# Patient Record
Sex: Male | Born: 1995 | Race: White | Hispanic: No | Marital: Single | Smoking: Former smoker
Health system: Southern US, Community
[De-identification: ages and names within clinical notes are randomized; demographics above are authoritative.]

## PROBLEM LIST (undated history)

## (undated) DIAGNOSIS — K219 Gastro-esophageal reflux disease without esophagitis: Secondary | ICD-10-CM

---
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
## 2015-06-17 ENCOUNTER — Ambulatory Visit
Admission: EM | Admit: 2015-06-17 | Discharge: 2015-06-17 | Disposition: A | Discharge status: Home / Self Care | Payer: Self-pay | Attending: Internal Medicine | Admitting: Internal Medicine

## 2015-06-17 ENCOUNTER — Encounter: Payer: Self-pay | Admitting: *Deleted

## 2015-06-17 DIAGNOSIS — K0889 Other specified disorders of teeth and supporting structures: Secondary | ICD-10-CM

## 2015-06-17 MED ORDER — IBUPROFEN 800 MG PO TABS: 800.0000 mg | ORAL_TABLET | Freq: Three times a day (TID) | ORAL | Status: DC | PRN

## 2015-06-17 MED ORDER — PENICILLIN V POTASSIUM 500 MG PO TABS: 500.0000 mg | ORAL_TABLET | Freq: Four times a day (QID) | ORAL | Status: DC

## 2015-06-17 MED ORDER — LIDOCAINE VISCOUS 2 % MT SOLN: 15.0000 mL | Freq: Three times a day (TID) | OROMUCOSAL | Status: DC | PRN

## 2015-06-17 MED ORDER — LIDOCAINE VISCOUS 2 % MT SOLN: OROMUCOSAL | Status: DC

## 2015-06-17 NOTE — ED Notes (Signed)
Pt states a dental abcess on left lower with pain x1 week.

## 2015-06-17 NOTE — Discharge Instructions (Signed)
Take medication as prescribed. Rest. Drink plenty of fluids.  ° °Follow up closely with your dentist. ° °Follow up with your primary care physician this week as needed. Return to Urgent care for new or worsening concerns.  ° °

## 2015-06-17 NOTE — ED Provider Notes (Signed)
[facility]  Time seen: Approximately 4:11 PM  I have reviewed the triage vital signs and the nursing notes.   HISTORY  Chief Complaint Dental Pain  HPI James Anthony is a 20 y.o. male presents for the complaint of dental pain to bilateral lower teeth. Patient reports that he was recently seen by his dentist and told that he had infection in his right lower gum and was encouraged to be seen elsewhere for prescription of antibiotics. Patient reports that he is having pain in these areas. Patient states current pain is 9 out of 10. Denies pain radiation. Reports able to continue to eat and drink well. Denies fevers. Denies fall or trauma. Reports issues with his teeth have been gradual and over several years as he states that he has many breaks in his teeth. Patient states that the breaks in his teeth or from his wisdom teeth coming in at an angle and states that his doctor is planning on removing his wisdom teeth.  Denies fevers. Denies pain radiation. Denies headache, nausea, vomiting, diarrhea, vision changes, facial swelling, difficulty eating or swallowing, neck pain, back pain, recent sickness or recent anabolic use.  History reviewed. No pertinent past medical history.  There are no active problems to display for this patient. denies  History reviewed. No pertinent past surgical history. denies No current outpatient prescriptions on file. denies Allergies Review of patient's allergies indicates no known allergies.  History reviewed. No pertinent family history.  Social History Social History  Substance Use Topics  . Smoking status: Never Smoker   . Smokeless tobacco: Current User    Types: Chew  . Alcohol Use: Yes    Review of Systems Constitutional: No fever/chills. Reports continues to eat and drink foods and fluids well.  Eyes: No visual changes. ENT: No sore throat.Positive dental pain as above. Cardiovascular: Denies chest pain. Respiratory: Denies  shortness of breath. Gastrointestinal: No abdominal pain.  No nausea, no vomiting.  Genitourinary: Negative for dysuria. Musculoskeletal: Negative for back pain. Skin: Negative for rash. Neurological: Negative for headaches, focal weakness or numbness. 10-point ROS otherwise negative.  ____________________________________________   PHYSICAL EXAM:  VITAL SIGNS: ED Triage Vitals  Enc Vitals Group     BP 06/17/15 1601 119/66 mmHg     Pulse Rate 06/17/15 1601 68     Resp 06/17/15 1601 16     Temp 06/17/15 1601 97.8 F (36.6 C)     Temp Source 06/17/15 1601 Oral     SpO2 06/17/15 1601 99 %     Weight 06/17/15 1601 162 lb (73.483 kg)     Height 06/17/15 1601 5\' 11"  (1.803 m)     Head Cir --      Peak Flow --      Pain Score 06/17/15 1606 9     Pain Loc --      Pain Edu? --      Excl. in GC? --     Constitutional: Alert and oriented. Well appearing and in no acute distress. Eyes: Conjunctivae are normal. PERRL. EOMI. Head: Atraumatic.No facial swelling or erythema noted. No trismus. Ears: Bilateral ears no erythema, normal TMs.  Nose: No congestion/rhinnorhea. Mouth/Throat: Mucous membranes are moist.  Oropharynx non-erythematous. No tonsillar swelling or exudate. Periodontal Exam  Tooth #: 32 31 30 29 28 27 26 25 24 23  22 21 20 19 18 17   Comment:                  PD L:  broken            broken   PD F:                    Widespread dental decay with multiple dental caries and fractured teeth, mild gum line erythema and swelling along the base of #30 and 18 and 19 and with mild tenderness to palpation, no palpable or visualized dental abscess, no drainage, no facial swelling. Lower wisdom teeth partially visible at gum line. No other gum or dental tenderness noted to palpation. Neck: No stridor.  Hematological/Lymphatic/Immunilogical: No cervical lymphadenopathy. Cardiovascular:   Normal rate, regular rhythm. Grossly normal heart sounds. Good peripheral  circulation. Respiratory: Normal respiratory effort.  No retractions. Musculoskeletal: No lower or upper extremity tenderness nor edema.  Neurologic:  Normal speech and language. No gross focal neurologic deficits are appreciated. Speech is normal. No gait instability. Skin:  Skin is warm, dry and intact. No rash noted. Psychiatric: Mood and affect are normal. Speech and behavior are normal.  ____________________________________________   LABS (all labs ordered are listed, but only abnormal results are displayed)  Labs Reviewed - No data to display ____________________________________________    INITIAL IMPRESSION / ASSESSMENT AND PLAN / ED COURSE  Pertinent labs & imaging results that were available during my care of the patient were reviewed by me and considered in my medical decision making (see chart for details).  Very well-appearing patient. No acute distress. Presents for the complaints of dental pain. Patient with widespread dental decay. No palpable or visualized abscess. Concern for dental infection. Patient smiling and texting on phone during exam and interview. Will treat patient with oral Pen- VK, when necessary ibuprofen and prescription for viscous lidocaine to hold to affected area and spit as needed. Encourage fluids and close dentist follow-up.Discussed indication, risks and benefits of medications with patient.Work note for today and tomorrow.    Patient was advised to see the dentist within 10 days. Also advised to take the antibiotic until finished. Instructed to return to the urgent care for symptoms that change or worsen or if unable to schedule an appointment.Discussed follow up with Primary care physician this week. Discussed follow up and return parameters including no resolution or any worsening concerns. Patient verbalized understanding and agreed to plan.   ____________________________________________   FINAL CLINICAL IMPRESSION(S) / ED DIAGNOSES  Final  diagnoses:  Pain, dental      Renford Dills, NP 06/17/15 1951

## 2015-06-28 ENCOUNTER — Ambulatory Visit
Admission: EM | Admit: 2015-06-28 | Discharge: 2015-06-28 | Disposition: A | Discharge status: Home / Self Care | Payer: Self-pay | Attending: Family Medicine | Admitting: Family Medicine

## 2015-06-28 DIAGNOSIS — K0889 Other specified disorders of teeth and supporting structures: Secondary | ICD-10-CM

## 2015-06-28 MED ORDER — PENICILLIN V POTASSIUM 500 MG PO TABS: 500.0000 mg | ORAL_TABLET | Freq: Four times a day (QID) | ORAL | Status: DC

## 2015-06-28 NOTE — ED Provider Notes (Signed)
[facility] ____________________________________________  Time seen: Approximately 10:38 AM  I have reviewed the triage vital signs and the nursing notes.   HISTORY  Chief Complaint Dental Pain   HPI James Anthony is a 20 y.o. male presents for complaint of dental pain. Patient states left lower dental pain with what he describes as an infection. Patient reports that he was seen in urgent care for the same complaint on June 13 and was prescribed penicillin. Patient states that he took this prescription as prescribed which did help the symptoms and states that he was no longer having pain. However patient reports within a few days of completing his prescription he began to have pain and swelling again. Patient states that he did go see his dentist 2 days ago and was told that he still had infection and to go to another facility to receive antibiotic treatment. Reports his dentist is Dr. Metta Clinescrisp in [place]. Patient reports few weeks ago when he was in urgent care he was having right and left lower dental pain and swelling, states the antibiotics took away the right and left discomfort and states that the left has returned only.  Patient reports chronic dental issues. Reports continues to eat and drink well. Denies fevers. Denies fall or trauma. States is a gradual onset.  Denies fevers. Denies pain radiation. Denies headache, nausea, vomiting, diarrhea, vision changes, facial swelling, difficulty eating or swallowing, neck pain, back pain, recent sickness or recent anabolic use.   Past Medical History  Diagnosis Date  . Patient denies medical problems     There are no active problems to display for this patient.   History reviewed. No pertinent past surgical history.  Current Outpatient Rx  Name  Route  Sig  Dispense  Refill  .           .           .             Allergies Review of patient's allergies indicates no known allergies.  History reviewed. No pertinent  family history.  Social History Social History  Substance Use Topics  . Smoking status: Never Smoker   . Smokeless tobacco: Current User    Types: Chew  . Alcohol Use: Yes     Comment: social    Review of Systems Constitutional: No fever/chills. Reports continues to eat and drink foods and fluids well.  Eyes: No visual changes. ENT: No sore throat. As above.  Cardiovascular: Denies chest pain. Respiratory: Denies shortness of breath. Gastrointestinal: No abdominal pain.  No nausea, no vomiting.  Genitourinary: Negative for dysuria. Musculoskeletal: Negative for back pain. Skin: Negative for rash. Neurological: Negative for headaches, focal weakness or numbness. 10-point ROS otherwise negative.  ____________________________________________   PHYSICAL EXAM:  VITAL SIGNS: ED Triage Vitals  Enc Vitals Group     BP 06/28/15 1014 112/61 mmHg     Pulse Rate 06/28/15 1014 55     Resp 06/28/15 1014 16     Temp 06/28/15 1014 97.5 F (36.4 C)     Temp Source 06/28/15 1014 Oral     SpO2 06/28/15 1014 100 %     Weight 06/28/15 1014 165 lb (74.844 kg)     Height 06/28/15 1014 5\' 11"  (1.803 m)     Head Cir --      Peak Flow --      Pain Score 06/28/15 1017 8     Pain Loc --  Pain Edu? --      Excl. in GC? --     Constitutional: Alert and oriented. Well appearing and in no acute distress. Eyes: Conjunctivae are normal. PERRL. EOMI. Head: Atraumatic.No facial swelling, erythema or induration palpation.  Ears: Bilateral ears no erythema, normal TMs.  Nose: No congestion/rhinnorhea. Mouth/Throat: Mucous membranes are moist.  Oropharynx non-erythematous. Periodontal Exam  Tooth #: 32 31 30 29 28 27 26 25 24 23  22 21 20 19 18 17   Comment:                  PD L:                  PD F:   Fractured           Fractured       Widespread dental decay with multiple dental caries and fractured teeth, mild gum line erythema and swelling along the base of 18 and 19 and with mild  tenderness to palpation along 18 and 19, no palpable or visualized dental abscess, no drainage, no facial swelling.   Lower wisdom teeth partially visible at gum line. No other gum or dental tenderness noted to palpation. Neck: No stridor.  Hematological/Lymphatic/Immunilogical: No cervical lymphadenopathy. Cardiovascular:   Normal rate, regular rhythm. Grossly normal heart sounds. Good peripheral circulation. Respiratory: Normal respiratory effort.  No retractions. Musculoskeletal: No lower or upper extremity tenderness nor edema.  No joint effusions. Neurologic:  Normal speech and language. No gross focal neurologic deficits are appreciated. Speech is normal. No gait instability. Skin:  Skin is warm, dry and intact. No rash noted. Psychiatric: Mood and affect are normal. Speech and behavior are normal. ____________________________________________   INITIAL IMPRESSION / ASSESSMENT AND PLAN / ED COURSE  Pertinent labs & imaging results that were available during my care of the patient were reviewed by me and considered in my medical decision making (see chart for details).   Very well-appearing patient. No acute distress. Presenting for dental pain. Patient recently seen in urgent care for the same. Patient reports he took antibiotics with good results however reports that the pain and swelling returned shortly after completing the antibiotics. Patient states that he did see his dentist but was referred elsewhere to receive another prescription of antibiotics. Discussed in detail with patient will treat again with antibiotics but encouraged to see the dentist this week while still taking the antibiotic to have further management. Oral Pen VK. PRN otc ibuprofen and tylenol.  Encouraged to eat soft food diet, rest, fluids and close follow-up.Discussed indication, risks and benefits of medications with patient.  Patient was advised to see the dentist within 7 days. Also advised to take the  antibiotic until finished. Instructed to return to the urgent care or ER for symptoms that change or worsen or if unable to schedule an appointment.Discussed follow up with Primary care physician this week. Discussed follow up and return parameters including no resolution or any worsening concerns. Patient verbalized understanding and agreed to plan.   ____________________________________________   FINAL CLINICAL IMPRESSION(S) / ED DIAGNOSES  Final diagnoses:  Pain, dental      Renford DillsLindsey Anaiz Qazi, NP 06/28/15 1114

## 2015-06-28 NOTE — Discharge Instructions (Signed)
Take medication as prescribed. Follow up with dentist this week. Drink plenty of fluids.   Follow up with your primary care physician this week as needed. Return to Urgent care for new or worsening concerns.

## 2015-08-24 ENCOUNTER — Encounter: Payer: Self-pay | Admitting: Emergency Medicine

## 2015-08-24 ENCOUNTER — Emergency Department
Admission: EM | Admit: 2015-08-24 | Discharge: 2015-08-24 | Disposition: A | Discharge status: Home / Self Care | Payer: No Typology Code available for payment source | Attending: Emergency Medicine | Admitting: Emergency Medicine

## 2015-08-24 ENCOUNTER — Emergency Department: Payer: No Typology Code available for payment source

## 2015-08-24 DIAGNOSIS — Y92410 Unspecified street and highway as the place of occurrence of the external cause: Secondary | ICD-10-CM | POA: Diagnosis not present

## 2015-08-24 DIAGNOSIS — Y999 Unspecified external cause status: Secondary | ICD-10-CM | POA: Diagnosis not present

## 2015-08-24 DIAGNOSIS — S30811A Abrasion of abdominal wall, initial encounter: Secondary | ICD-10-CM | POA: Diagnosis not present

## 2015-08-24 DIAGNOSIS — S161XXA Strain of muscle, fascia and tendon at neck level, initial encounter: Secondary | ICD-10-CM | POA: Insufficient documentation

## 2015-08-24 DIAGNOSIS — S2220XA Unspecified fracture of sternum, initial encounter for closed fracture: Secondary | ICD-10-CM | POA: Insufficient documentation

## 2015-08-24 DIAGNOSIS — F17220 Nicotine dependence, chewing tobacco, uncomplicated: Secondary | ICD-10-CM | POA: Insufficient documentation

## 2015-08-24 DIAGNOSIS — Y9389 Activity, other specified: Secondary | ICD-10-CM | POA: Diagnosis not present

## 2015-08-24 DIAGNOSIS — T148XXA Other injury of unspecified body region, initial encounter: Secondary | ICD-10-CM

## 2015-08-24 DIAGNOSIS — R109 Unspecified abdominal pain: Secondary | ICD-10-CM | POA: Diagnosis not present

## 2015-08-24 DIAGNOSIS — S299XXA Unspecified injury of thorax, initial encounter: Secondary | ICD-10-CM | POA: Diagnosis present

## 2015-08-24 LAB — CBC WITH DIFFERENTIAL/PLATELET
BASOS ABS: 0 10*3/uL (ref 0–0.1)
BASOS PCT: 1 %
EOS ABS: 0.1 10*3/uL (ref 0–0.7)
Eosinophils Relative: 2 %
HEMATOCRIT: 43.8 % (ref 40.0–52.0)
HEMOGLOBIN: 15.3 g/dL (ref 13.0–18.0)
Lymphocytes Relative: 22 %
Lymphs Abs: 1.6 10*3/uL (ref 1.0–3.6)
MCH: 29.4 pg (ref 26.0–34.0)
MCHC: 34.9 g/dL (ref 32.0–36.0)
MCV: 84.1 fL (ref 80.0–100.0)
Monocytes Absolute: 0.6 10*3/uL (ref 0.2–1.0)
Monocytes Relative: 8 %
NEUTROS PCT: 69 %
Neutro Abs: 5.1 10*3/uL (ref 1.4–6.5)
Platelets: 184 10*3/uL (ref 150–440)
RBC: 5.2 MIL/uL (ref 4.40–5.90)
RDW: 13.9 % (ref 11.5–14.5)
WBC: 7.4 10*3/uL (ref 3.8–10.6)

## 2015-08-24 LAB — COMPREHENSIVE METABOLIC PANEL
ALBUMIN: 4.8 g/dL (ref 3.5–5.0)
ALK PHOS: 58 U/L (ref 38–126)
ALT: 34 U/L (ref 17–63)
ANION GAP: 9 (ref 5–15)
AST: 38 U/L (ref 15–41)
BILIRUBIN TOTAL: 0.9 mg/dL (ref 0.3–1.2)
BUN: 13 mg/dL (ref 6–20)
CALCIUM: 9.5 mg/dL (ref 8.9–10.3)
CO2: 26 mmol/L (ref 22–32)
Chloride: 103 mmol/L (ref 101–111)
Creatinine, Ser: 0.93 mg/dL (ref 0.61–1.24)
GLUCOSE: 129 mg/dL — AB (ref 65–99)
Potassium: 3.6 mmol/L (ref 3.5–5.1)
Sodium: 138 mmol/L (ref 135–145)
TOTAL PROTEIN: 7.9 g/dL (ref 6.5–8.1)

## 2015-08-24 LAB — TROPONIN I: Troponin I: <0.03 ng/mL (ref <0.03)

## 2015-08-24 MED ORDER — IOPAMIDOL (ISOVUE-300) INJECTION 61%
100.0000 mL | Freq: Once | INTRAVENOUS | Status: AC | PRN
  Administered 2015-08-24: 100 mL via INTRAVENOUS

## 2015-08-24 MED ORDER — DIAZEPAM 5 MG PO TABS: 5.0000 mg | ORAL_TABLET | Freq: Three times a day (TID) | ORAL | 0 refills | Status: DC | PRN

## 2015-08-24 MED ORDER — OXYCODONE-ACETAMINOPHEN 5-325 MG PO TABS: 2.0000 | ORAL_TABLET | Freq: Four times a day (QID) | ORAL | 0 refills | Status: DC | PRN

## 2015-08-24 MED ORDER — IBUPROFEN 800 MG PO TABS: 800.0000 mg | ORAL_TABLET | Freq: Three times a day (TID) | ORAL | 0 refills | Status: DC | PRN

## 2015-08-24 MED ORDER — OXYCODONE-ACETAMINOPHEN 5-325 MG PO TABS
2.0000 | ORAL_TABLET | Freq: Once | ORAL | Status: AC
  Administered 2015-08-24: 2 via ORAL
  Filled 2015-08-24: qty 2

## 2015-08-24 MED ORDER — SODIUM CHLORIDE 0.9 % IV SOLN
Freq: Once | INTRAVENOUS | Status: AC
  Administered 2015-08-24: 1000 mL via INTRAVENOUS

## 2015-08-24 MED ORDER — MORPHINE SULFATE (PF) 4 MG/ML IV SOLN
4.0000 mg | Freq: Once | INTRAVENOUS | Status: AC
  Administered 2015-08-24: 4 mg via INTRAVENOUS
  Filled 2015-08-24: qty 1
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
## 2017-10-03 ENCOUNTER — Encounter: Payer: Self-pay | Admitting: Emergency Medicine

## 2017-10-03 ENCOUNTER — Other Ambulatory Visit: Payer: Self-pay

## 2017-10-03 ENCOUNTER — Ambulatory Visit
Admission: EM | Admit: 2017-10-03 | Discharge: 2017-10-03 | Discharge status: Against Medical Advice | Payer: Self-pay | Attending: Family Medicine | Admitting: Family Medicine

## 2017-10-03 HISTORY — DX: Gastro-esophageal reflux disease without esophagitis: K21.9

## 2017-10-03 NOTE — ED Triage Notes (Signed)
Patient in today c/o emesis off & on since eating [employer] pizza on Saturday (10/01/17). Patient has felt warm this morning, but hasn't taken temperature. Patient has not tried any OTC medication.

## 2020-09-15 ENCOUNTER — Ambulatory Visit
Admission: EM | Admit: 2020-09-15 | Discharge: 2020-09-15 | Disposition: A | Discharge status: Home / Self Care | Payer: Self-pay | Attending: Internal Medicine | Admitting: Internal Medicine

## 2020-09-15 ENCOUNTER — Encounter: Payer: Self-pay | Admitting: Emergency Medicine

## 2020-09-15 ENCOUNTER — Other Ambulatory Visit: Payer: Self-pay

## 2020-09-15 DIAGNOSIS — K047 Periapical abscess without sinus: Secondary | ICD-10-CM

## 2020-09-15 MED ORDER — HYDROCODONE-ACETAMINOPHEN 5-325 MG PO TABS: 1.0000 | ORAL_TABLET | Freq: Four times a day (QID) | ORAL | 0 refills | Status: DC | PRN

## 2020-09-15 MED ORDER — PENICILLIN V POTASSIUM 500 MG PO TABS: 500.0000 mg | ORAL_TABLET | Freq: Four times a day (QID) | ORAL | 0 refills | Status: AC

## 2020-09-15 NOTE — ED Provider Notes (Signed)
[facility]    CSN: [id number] Arrival date & time: 09/15/20  1934      History   Chief Complaint Chief Complaint  Patient presents with   Dental Pain    Right upper,lower    HPI James Anthony is a 25 y.o. male who presents today with R upper and lower teeth pain x 2 weeks. He would like information to go to a dental clinic. Was supposed to have them extracted in 2017 but he never had it done. He states he brushes his teeth tid, but does not seem to help    Past Medical History:  Diagnosis Date   GERD (gastroesophageal reflux disease)     There are no problems to display for this patient.   History reviewed. No pertinent surgical history.     Home Medications    Prior to Admission medications   Medication Sig Start Date End Date Taking? Authorizing Provider  penicillin v potassium (VEETID) 500 MG tablet Take 1 tablet (500 mg total) by mouth 4 (four) times daily for 10 days. 09/15/20 09/25/20 Yes Rodriguez-Southworth, Nettie Elm, PA-C  HYDROcodone-acetaminophen (NORCO/VICODIN) 5-325 MG tablet Take 1 tablet by mouth every 6 (six) hours as needed. 09/15/20   Rodriguez-Southworth, Nettie Elm, PA-C    Family History Family History  Problem Relation Age of Onset   Hypertension Mother    Pancreatitis Mother    Other Father        unknown medical history    Social History Social History   Tobacco Use   Smoking status: Former   Smokeless tobacco: Former    Types: Associate Professor Use: Every day  Substance Use Topics   Alcohol use: Not Currently    Comment: social   Drug use: Never     Allergies   Patient has no known allergies.   Review of Systems Review of Systems  Constitutional:  Negative for chills, diaphoresis, fatigue and fever.  HENT:  Positive for dental problem.   Hematological:  Negative for adenopathy.   Physical Exam Triage Vital Signs ED Triage Vitals [09/15/20 1958]  Enc Vitals Group     BP 132/73     Pulse Rate 66      Resp 18     Temp 98.5 F (36.9 C)     Temp Source Oral     SpO2 96 %     Weight      Height      Head Circumference      Peak Flow      Pain Score 10     Pain Loc      Pain Edu?      Excl. in GC?    No data found.  Updated Vital Signs BP 132/73 (BP Location: Right Arm)   Pulse 66   Temp 98.5 F (36.9 C) (Oral)   Resp 18   SpO2 96%   Visual Acuity Right Eye Distance:   Left Eye Distance:   Bilateral Distance:    Right Eye Near:   Left Eye Near:    Bilateral Near:     Physical Exam Vitals and nursing note reviewed.  Constitutional:      General: He is not in acute distress.    Appearance: He is not toxic-appearing.  HENT:     Head: Normocephalic.     Right Ear: External ear normal.     Left Ear: External ear normal.     Mouth/Throat:     Comments:  Has multiple carious molars upper and lower in the back that are gone to the root. The gums are swollen and red around these bad teeth.  Musculoskeletal:     Cervical back: Neck supple.  Lymphadenopathy:     Cervical: No cervical adenopathy.  Neurological:     Mental Status: He is alert.  Psychiatric:        Mood and Affect: Mood normal.        Behavior: Behavior normal.        Thought Content: Thought content normal.        Judgment: Judgment normal.     UC Treatments / Results  Labs (all labs ordered are listed, but only abnormal results are displayed) Labs Reviewed - No data to display  EKG   Radiology No results found.  Procedures Procedures (including critical care time)  Medications Ordered in UC Medications - No data to display  Initial Impression / Assessment and Plan / UC Course  I have reviewed the triage vital signs and the nursing notes. Poor dental condition with infection I placed him on Penicillin and Norco as noted. He is serous about following up with dentist this time.      Final Clinical Impressions(s) / UC Diagnoses   Final diagnoses:  Dental infection      Discharge Instructions      You may take Ibuprofen 800 mg every 8 hours with food if the norco causes sedation   [facility] [address] [place], [place] [address]  Call anytime between 6:30am - 11pm Monday-Friday, Saturdays 7am - 9pm, and Sundays 7am - 5:30pm ET to make an appointment  [phone]     ED Prescriptions     Medication Sig Dispense Auth. Provider   penicillin v potassium (VEETID) 500 MG tablet Take 1 tablet (500 mg total) by mouth 4 (four) times daily for 10 days. 40 tablet Rodriguez-Southworth, Nettie Elm, PA-C   HYDROcodone-acetaminophen (NORCO/VICODIN) 5-325 MG tablet  (Status: Discontinued) Take 1 tablet by mouth every 6 (six) hours as needed. 10 tablet Rodriguez-Southworth, Nettie Elm, PA-C   HYDROcodone-acetaminophen (NORCO/VICODIN) 5-325 MG tablet Take 1 tablet by mouth every 6 (six) hours as needed. 10 tablet Rodriguez-Southworth, Nettie Elm, PA-C      I have reviewed the PDMP during this encounter.   Garey Ham, Cordelia Poche 09/15/20 2048

## 2020-09-15 NOTE — ED Triage Notes (Signed)
Pt presents today with c/o of tooth pain to right upper and lower side x 2 weeks. He would also like referral to dental clinics, if possible.

## 2020-09-15 NOTE — Discharge Instructions (Addendum)
You may take Ibuprofen 800 mg every 8 hours with food if the norco causes sedation   [facility] [address] [place], [place] [address]  Call anytime between 6:30am - 11pm Monday-Friday, Saturdays 7am - 9pm, and Sundays 7am - 5:30pm ET to make an appointment  [phone]

## 2020-10-22 ENCOUNTER — Other Ambulatory Visit: Payer: Self-pay

## 2020-10-22 ENCOUNTER — Ambulatory Visit
Admission: EM | Admit: 2020-10-22 | Discharge: 2020-10-22 | Disposition: A | Discharge status: Home / Self Care | Payer: Self-pay | Attending: Physician Assistant | Admitting: Physician Assistant

## 2020-10-22 DIAGNOSIS — K047 Periapical abscess without sinus: Secondary | ICD-10-CM

## 2020-10-22 DIAGNOSIS — K0889 Other specified disorders of teeth and supporting structures: Secondary | ICD-10-CM

## 2020-10-22 MED ORDER — AMOXICILLIN-POT CLAVULANATE 875-125 MG PO TABS: 1.0000 | ORAL_TABLET | Freq: Two times a day (BID) | ORAL | 0 refills | Status: AC

## 2020-10-22 NOTE — Discharge Instructions (Signed)
-  I have sent Augmentin which is stronger version of amoxicillin to pharmacy. -Continue with supportive care at home.  And follow-up with your dentist. -Use the good Rx app and put this medication on your phone.  He can get it for quite a bit cheaper through the app.

## 2020-10-22 NOTE — ED Provider Notes (Signed)
[facility]    CSN: [id number] Arrival date & time: 10/22/20  1804      History   Chief Complaint Chief Complaint  Patient presents with   Dental Pain    HPI James Anthony is a 25 y.o. male presenting for dental pain in the right upper side.  Patient says he has had pain off and on for a while.  He did go to the dentist about a month ago and says he had a tooth near it pulled.  He contacted his dentist this week and advised him that he was having pain again.  They advised him to go to urgent care to have an antibiotic before they see him again to clear up any infection before they consider pulling another tooth.  Patient has been taking over-the-counter Tylenol but says it upsets his stomach.  He says ibuprofen has not helped.  He did take Augmentin in the past and says that is helped him the most.  He requests antibiotics.  He has not had any fevers or facial swelling.  No other complaints.  HPI  Past Medical History:  Diagnosis Date   GERD (gastroesophageal reflux disease)     There are no problems to display for this patient.   History reviewed. No pertinent surgical history.     Home Medications    Prior to Admission medications   Medication Sig Start Date End Date Taking? Authorizing Provider  amoxicillin-clavulanate (AUGMENTIN) 875-125 MG tablet Take 1 tablet by mouth every 12 (twelve) hours for 10 days. 10/22/20 11/01/20 Yes Shirlee Latch, PA-C    Family History Family History  Problem Relation Age of Onset   Hypertension Mother    Pancreatitis Mother    Other Father        unknown medical history    Social History Social History   Tobacco Use   Smoking status: Former   Smokeless tobacco: Former    Types: Associate Professor Use: Every day  Substance Use Topics   Alcohol use: Not Currently    Comment: social   Drug use: Never     Allergies   Patient has no known allergies.   Review of Systems Review of Systems   Constitutional:  Negative for fatigue and fever.  HENT:  Positive for dental problem. Negative for facial swelling.   Hematological:  Negative for adenopathy.    Physical Exam Triage Vital Signs ED Triage Vitals  Enc Vitals Group     BP 10/22/20 1821 (!) 147/85     Pulse --      Resp 10/22/20 1821 18     Temp 10/22/20 1821 98.5 F (36.9 C)     Temp Source 10/22/20 1821 Oral     SpO2 10/22/20 1821 97 %     Weight 10/22/20 1821 230 lb (104.3 kg)     Height 10/22/20 1821 6' (1.829 m)     Head Circumference --      Peak Flow --      Pain Score 10/22/20 1820 10     Pain Loc --      Pain Edu? --      Excl. in GC? --    No data found.  Updated Vital Signs BP (!) 147/85 (BP Location: Left Arm)   Temp 98.5 F (36.9 C) (Oral)   Resp 18   Ht 6' (1.829 m)   Wt 230 lb (104.3 kg)   SpO2 97%   BMI 31.19  kg/m      Physical Exam Vitals and nursing note reviewed.  Constitutional:      General: He is not in acute distress.    Appearance: Normal appearance. He is well-developed. He is not ill-appearing.  HENT:     Head: Normocephalic and atraumatic.     Mouth/Throat:     Mouth: Mucous membranes are moist.     Dentition: Abnormal dentition. Dental caries present.     Pharynx: Oropharynx is clear.      Comments: Tenderness of tooth in picture with surrounding erythema/swelling. +multiple caries Eyes:     General: No scleral icterus.    Conjunctiva/sclera: Conjunctivae normal.  Cardiovascular:     Rate and Rhythm: Normal rate and regular rhythm.  Pulmonary:     Effort: Pulmonary effort is normal. No respiratory distress.  Musculoskeletal:     Cervical back: Neck supple.  Skin:    General: Skin is warm and dry.  Neurological:     General: No focal deficit present.     Mental Status: He is alert. Mental status is at baseline.     Motor: No weakness.     Coordination: Coordination normal.     Gait: Gait normal.  Psychiatric:        Mood and Affect: Mood normal.         Behavior: Behavior normal.        Thought Content: Thought content normal.     UC Treatments / Results  Labs (all labs ordered are listed, but only abnormal results are displayed) Labs Reviewed - No data to display  EKG   Radiology No results found.  Procedures Procedures (including critical care time)  Medications Ordered in UC Medications - No data to display  Initial Impression / Assessment and Plan / UC Course  I have reviewed the triage vital signs and the nursing notes.  Pertinent labs & imaging results that were available during my care of the patient were reviewed by me and considered in my medical decision making (see chart for details).  25 year old male presenting for recurrent dental pain of tooth of the right upper side.  Patient does have a dentist and had a tooth pulled about a month ago.  Treating patient at this time with Augmentin for dental infection.  Advised him to follow-up with a dentist.  ED precautions reviewed.  Final Clinical Impressions(s) / UC Diagnoses   Final diagnoses:  Pain, dental  Dental infection     Discharge Instructions      -I have sent Augmentin which is stronger version of amoxicillin to pharmacy. -Continue with supportive care at home.  And follow-up with your dentist. -Use the good Rx app and put this medication on your phone.  He can get it for quite a bit cheaper through the app.     ED Prescriptions     Medication Sig Dispense Auth. Provider   amoxicillin-clavulanate (AUGMENTIN) 875-125 MG tablet Take 1 tablet by mouth every 12 (twelve) hours for 10 days. 20 tablet Gareth Morgan      PDMP not reviewed this encounter.   Shirlee Latch, PA-C 10/22/20 1905

## 2020-10-22 NOTE — ED Triage Notes (Signed)
Pt here with C/O tooth pain to right upper side for a while. States that went to Dentist had 1 tooth pulled was told needed 800mg  amoxicillin to get rid of rest of infection to pull other teeth.

## 2022-08-26 ENCOUNTER — Ambulatory Visit
Admission: EM | Admit: 2022-08-26 | Discharge: 2022-08-26 | Disposition: A | Discharge status: Home / Self Care | Payer: Self-pay | Attending: Internal Medicine | Admitting: Internal Medicine

## 2022-08-26 DIAGNOSIS — J988 Other specified respiratory disorders: Secondary | ICD-10-CM

## 2022-08-26 DIAGNOSIS — B9789 Other viral agents as the cause of diseases classified elsewhere: Secondary | ICD-10-CM

## 2022-08-26 MED ORDER — IBUPROFEN 600 MG PO TABS: 600.0000 mg | ORAL_TABLET | Freq: Four times a day (QID) | ORAL | 0 refills | Status: DC | PRN

## 2022-08-26 MED ORDER — FLUTICASONE PROPIONATE 50 MCG/ACT NA SUSP: 1.0000 | Freq: Every day | NASAL | 0 refills | Status: DC

## 2022-08-26 NOTE — ED Provider Notes (Signed)
[facility]    CSN: [id number] Arrival date & time: 08/26/22  1014      History   Chief Complaint Chief Complaint  Patient presents with   Back Pain   Fatigue        Nasal Congestion    HPI James Anthony is a 27 y.o. male comes to the urgent care with 1 day history of generalized bodyaches, generalized fatigue and tiredness as well as nasal congestion and nonproductive cough.  Patient symptoms started fairly abruptly and has been persistent.  He denies any sick contacts.  He denies any shortness of breath, chest tightness or wheezing.  No nausea, vomiting or diarrhea.  He denies any fever or chills.  Patient is fully vaccinated against COVID-19 virus.  No rashes noted.   HPI  Past Medical History:  Diagnosis Date   GERD (gastroesophageal reflux disease)     There are no problems to display for this patient.   History reviewed. No pertinent surgical history.     Home Medications    Prior to Admission medications   Medication Sig Start Date End Date Taking? Authorizing Provider  fluticasone (FLONASE) 50 MCG/ACT nasal spray Place 1 spray into both nostrils daily. 08/26/22  Yes Jezel Basto, Britta Mccreedy, MD  ibuprofen (ADVIL) 600 MG tablet Take 1 tablet (600 mg total) by mouth every 6 (six) hours as needed. 08/26/22  Yes Franko Hilliker, Britta Mccreedy, MD    Family History Family History  Problem Relation Age of Onset   Hypertension Mother    Pancreatitis Mother    Other Father        unknown medical history    Social History Social History   Tobacco Use   Smoking status: Former   Smokeless tobacco: Former    Types: Associate Professor status: Former  Substance Use Topics   Alcohol use: Not Currently    Comment: social   Drug use: Never     Allergies   Patient has no known allergies.   Review of Systems Review of Systems As per HPI  Physical Exam Triage Vital Signs ED Triage Vitals  Encounter Vitals Group     BP 08/26/22 1027 126/80      Systolic BP Percentile --      Diastolic BP Percentile --      Pulse Rate 08/26/22 1027 85     Resp --      Temp 08/26/22 1027 98.7 F (37.1 C)     Temp Source 08/26/22 1027 Oral     SpO2 08/26/22 1027 99 %     Weight 08/26/22 1025 225 lb (102.1 kg)     Height 08/26/22 1025 5\' 9"  (1.753 m)     Head Circumference --      Peak Flow --      Pain Score 08/26/22 1024 8     Pain Loc --      Pain Education --      Exclude from Growth Chart --    No data found.  Updated Vital Signs BP 126/80 (BP Location: Left Arm)   Pulse 85   Temp 98.7 F (37.1 C) (Oral)   Ht 5\' 9"  (1.753 m)   Wt 102.1 kg   SpO2 99%   BMI 33.23 kg/m   Visual Acuity Right Eye Distance:   Left Eye Distance:   Bilateral Distance:    Right Eye Near:   Left Eye Near:    Bilateral Near:  Physical Exam Vitals and nursing note reviewed.  Constitutional:      General: He is not in acute distress.    Appearance: He is ill-appearing.  Cardiovascular:     Rate and Rhythm: Normal rate and regular rhythm.     Pulses: Normal pulses.     Heart sounds: Normal heart sounds.  Pulmonary:     Effort: Pulmonary effort is normal.     Breath sounds: Normal breath sounds.  Neurological:     Mental Status: He is alert.      UC Treatments / Results  Labs (all labs ordered are listed, but only abnormal results are displayed) Labs Reviewed - No data to display  EKG   Radiology No results found.  Procedures Procedures (including critical care time)  Medications Ordered in UC Medications - No data to display  Initial Impression / Assessment and Plan / UC Course  I have reviewed the triage vital signs and the nursing notes.  Pertinent labs & imaging results that were available during my care of the patient were reviewed by me and considered in my medical decision making (see chart for details).     1.  Viral respiratory illness: Home COVID test was negative as per the patient Patient declined COVID  testing in the clinic The patient is advised to increase oral fluid intake Tylenol or ibuprofen as needed for pain/fever Fluticasone nasal spray to help with nasal congestion Return precautions given. Final Clinical Impressions(s) / UC Diagnoses   Final diagnoses:  Viral respiratory illness     Discharge Instructions      Maintain adequate hydration Take ibuprofen as needed for pain and/or fever Use prescribed medications as directed If you have worsening symptoms please return to urgent care to be reevaluated.   ED Prescriptions     Medication Sig Dispense Auth. Provider   ibuprofen (ADVIL) 600 MG tablet Take 1 tablet (600 mg total) by mouth every 6 (six) hours as needed. 30 tablet Amie Cowens, Britta Mccreedy, MD   fluticasone (FLONASE) 50 MCG/ACT nasal spray Place 1 spray into both nostrils daily. 16 g Pao Haffey, Britta Mccreedy, MD      PDMP not reviewed this encounter.   Merrilee Jansky, MD 08/26/22 [phone]
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
## 2022-08-26 NOTE — Discharge Instructions (Signed)
Maintain adequate hydration Take ibuprofen as needed for pain and/or fever Use prescribed medications as directed If you have worsening symptoms please return to urgent care to be reevaluated.

## 2022-11-11 ENCOUNTER — Encounter: Payer: Self-pay | Admitting: Emergency Medicine

## 2022-11-11 ENCOUNTER — Ambulatory Visit
Admission: EM | Admit: 2022-11-11 | Discharge: 2022-11-11 | Disposition: A | Discharge status: Home / Self Care | Payer: Self-pay | Attending: Emergency Medicine | Admitting: Emergency Medicine

## 2022-11-11 DIAGNOSIS — K047 Periapical abscess without sinus: Secondary | ICD-10-CM

## 2022-11-11 MED ORDER — AMOXICILLIN-POT CLAVULANATE 875-125 MG PO TABS: 1.0000 | ORAL_TABLET | Freq: Two times a day (BID) | ORAL | 0 refills | Status: AC

## 2022-11-11 NOTE — ED Provider Notes (Signed)
[facility]    CSN: [id number] Arrival date & time: 11/11/22  1637      History   Chief Complaint Chief Complaint  Patient presents with   Dental Pain    HPI James Anthony is a 27 y.o. male.   HPI  27 year old male with past medical history significant for GERD presents for evaluation of 3 to 4 days with abdominal pain.  He reports that he went to a walk-in dental clinic in [place] and had his teeth cleaned today.  He was told by the dentist that he needed to come to the urgent care to get antibiotics due to having an abscessed tooth.  He is unsure what the name of the dental clinic was in [place].  Past Medical History:  Diagnosis Date   GERD (gastroesophageal reflux disease)     There are no problems to display for this patient.   History reviewed. No pertinent surgical history.     Home Medications    Prior to Admission medications   Medication Sig Start Date End Date Taking? Authorizing Provider  amoxicillin-clavulanate (AUGMENTIN) 875-125 MG tablet Take 1 tablet by mouth every 12 (twelve) hours for 10 days. 11/11/22 11/21/22 Yes Becky Augusta, NP  fluticasone (FLONASE) 50 MCG/ACT nasal spray Place 1 spray into both nostrils daily. 08/26/22   Merrilee Jansky, MD  ibuprofen (ADVIL) 600 MG tablet Take 1 tablet (600 mg total) by mouth every 6 (six) hours as needed. 08/26/22   Lamptey, Britta Mccreedy, MD    Family History Family History  Problem Relation Age of Onset   Hypertension Mother    Pancreatitis Mother    Other Father        unknown medical history    Social History Social History   Tobacco Use   Smoking status: Former   Smokeless tobacco: Former    Types: Associate Professor status: Former  Substance Use Topics   Alcohol use: Not Currently    Comment: social   Drug use: Never     Allergies   Patient has no known allergies.   Review of Systems Review of Systems  Constitutional:  Negative for fever.  HENT:  Positive  for dental problem.      Physical Exam Triage Vital Signs ED Triage Vitals  Encounter Vitals Group     BP      Systolic BP Percentile      Diastolic BP Percentile      Pulse      Resp      Temp      Temp src      SpO2      Weight      Height      Head Circumference      Peak Flow      Pain Score      Pain Loc      Pain Education      Exclude from Growth Chart    No data found.  Updated Vital Signs BP (!) 141/87 (BP Location: Left Arm)   Pulse (!) 55   Temp 97.8 F (36.6 C) (Oral)   Resp 18   SpO2 98%   Visual Acuity Right Eye Distance:   Left Eye Distance:   Bilateral Distance:    Right Eye Near:   Left Eye Near:    Bilateral Near:     Physical Exam Vitals and nursing note reviewed.  Constitutional:      Appearance: Normal appearance.  He is not ill-appearing.  HENT:     Mouth/Throat:     Mouth: Mucous membranes are moist.     Pharynx: Oropharynx is clear. Posterior oropharyngeal erythema present. No oropharyngeal exudate.     Comments: Patient second and third molar on the right upper side are broken at the gumline and the surrounding gum tissue is erythematous.  No appreciable exudate noted. Neurological:     Mental Status: He is alert.      UC Treatments / Results  Labs (all labs ordered are listed, but only abnormal results are displayed) Labs Reviewed - No data to display  EKG   Radiology No results found.  Procedures Procedures (including critical care time)  Medications Ordered in UC Medications - No data to display  Initial Impression / Assessment and Plan / UC Course  I have reviewed the triage vital signs and the nursing notes.  Pertinent labs & imaging results that were available during my care of the patient were reviewed by me and considered in my medical decision making (see chart for details).   Patient is a nontoxic-appearing 27 year old male presenting for evaluation of 3 to 4 days worth of dental pain.  On exam he has  got marked dental decay in his mouth and multiple broken teeth.  The tooth in question is his right upper third molar which is broken at the gumline, as is the second molar.  The surrounding gum tissue is erythematous but no appreciable exudate.  I will treat the patient for a dental abscess with Augmentin 875 twice daily for 10 days.  He should rinse with salt water or Listerine following meals and he can use over-the-counter Tylenol and/or ibuprofen as needed for pain.  He should follow-up with the dentist at the walk-in dental clinic when he finishes his antibiotics.  Work note provided.   Final Clinical Impressions(s) / UC Diagnoses   Final diagnoses:  Dental abscess     Discharge Instructions      Take the Augmentin twice daily with food for 10 days for treatment of your dental infection.  Use over-the-counter Tylenol and ibuprofen for swelling and mild to moderate pain.  Rinse with warm salt water, or Listerine, after each meal to remove food particles and wash away any pus that is collecting.  If you develop any increasing or swelling, fever, pain, or difficulty swallowing you to go to the emergency department at [facility] with a have an oral surgeon and also a dentist on-call.      ED Prescriptions     Medication Sig Dispense Auth. Provider   amoxicillin-clavulanate (AUGMENTIN) 875-125 MG tablet Take 1 tablet by mouth every 12 (twelve) hours for 10 days. 20 tablet Becky Augusta, NP      PDMP not reviewed this encounter.   Becky Augusta, NP 11/11/22 1710

## 2022-11-11 NOTE — ED Triage Notes (Signed)
Pt presents with dental pain x 3 days. He went to do the dentist, but was told he needs antibiotics.

## 2022-11-11 NOTE — Discharge Instructions (Addendum)
Take the Augmentin twice daily with food for 10 days for treatment of your dental infection.  Use over-the-counter Tylenol and ibuprofen for swelling and mild to moderate pain.  Rinse with warm salt water, or Listerine, after each meal to remove food particles and wash away any pus that is collecting.  If you develop any increasing or swelling, fever, pain, or difficulty swallowing you to go to the emergency department at [facility] with a have an oral surgeon and also a dentist on-call.

## 2023-01-13 ENCOUNTER — Ambulatory Visit
Admission: EM | Admit: 2023-01-13 | Discharge: 2023-01-13 | Disposition: A | Discharge status: Home / Self Care | Payer: Self-pay

## 2023-01-13 NOTE — ED Triage Notes (Signed)
 Pt c/o fall while at work and chipping his tooth.  Pt declines workers comp.  Pt has a broken deciduous tooth on the upper left side  Pt was given amoxicillin  by his dentist due to possible infection and states that he can not get the tooth pulled yet.  Pt has last had tylenol  4 hours ago.   Pt has an appointment with his dentist next Friday.

## 2023-07-10 ENCOUNTER — Encounter: Payer: Self-pay | Admitting: Emergency Medicine

## 2023-07-10 ENCOUNTER — Ambulatory Visit: Payer: Self-pay | Admitting: Emergency Medicine

## 2023-07-10 ENCOUNTER — Ambulatory Visit
Admission: EM | Admit: 2023-07-10 | Discharge: 2023-07-10 | Disposition: A | Discharge status: Home / Self Care | Payer: Self-pay | Attending: Emergency Medicine | Admitting: Emergency Medicine

## 2023-07-10 ENCOUNTER — Ambulatory Visit (INDEPENDENT_AMBULATORY_CARE_PROVIDER_SITE_OTHER): Payer: Self-pay

## 2023-07-10 DIAGNOSIS — R109 Unspecified abdominal pain: Secondary | ICD-10-CM

## 2023-07-10 LAB — COMPREHENSIVE METABOLIC PANEL WITH GFR
ALT: 37 U/L (ref 0–44)
AST: 29 U/L (ref 15–41)
Albumin: 4.5 g/dL (ref 3.5–5.0)
Alkaline Phosphatase: 69 U/L (ref 38–126)
Anion gap: 9 (ref 5–15)
BUN: 13 mg/dL (ref 6–20)
CO2: 24 mmol/L (ref 22–32)
Calcium: 9.3 mg/dL (ref 8.9–10.3)
Chloride: 104 mmol/L (ref 98–111)
Creatinine, Ser: 0.86 mg/dL (ref 0.61–1.24)
GFR, Estimated: >60 mL/min (ref >60)
Glucose, Bld: 107 mg/dL — ABNORMAL HIGH (ref 70–99)
Potassium: 4 mmol/L (ref 3.5–5.1)
Sodium: 137 mmol/L (ref 135–145)
Total Bilirubin: 0.7 mg/dL (ref 0.0–1.2)
Total Protein: 7.8 g/dL (ref 6.5–8.1)

## 2023-07-10 LAB — CBC WITH DIFFERENTIAL/PLATELET
Abs Immature Granulocytes: 0.03 K/uL (ref 0.00–0.07)
Basophils Absolute: 0 K/uL (ref 0.0–0.1)
Basophils Relative: 0 %
Eosinophils Absolute: 0.2 K/uL (ref 0.0–0.5)
Eosinophils Relative: 2 %
HCT: 41.4 % (ref 39.0–52.0)
Hemoglobin: 14.6 g/dL (ref 13.0–17.0)
Immature Granulocytes: 0 %
Lymphocytes Relative: 19 %
Lymphs Abs: 1.5 K/uL (ref 0.7–4.0)
MCH: 29.4 pg (ref 26.0–34.0)
MCHC: 35.3 g/dL (ref 30.0–36.0)
MCV: 83.3 fL (ref 80.0–100.0)
Monocytes Absolute: 0.8 K/uL (ref 0.1–1.0)
Monocytes Relative: 10 %
Neutro Abs: 5.4 K/uL (ref 1.7–7.7)
Neutrophils Relative %: 69 %
Platelets: 238 K/uL (ref 150–400)
RBC: 4.97 MIL/uL (ref 4.22–5.81)
RDW: 13.1 % (ref 11.5–15.5)
WBC: 7.9 K/uL (ref 4.0–10.5)
nRBC: 0 % (ref 0.0–0.2)

## 2023-07-10 LAB — URINALYSIS, ROUTINE W REFLEX MICROSCOPIC
Bilirubin Urine: NEGATIVE
Glucose, UA: NEGATIVE mg/dL
Hgb urine dipstick: NEGATIVE
Ketones, ur: NEGATIVE mg/dL
Leukocytes,Ua: NEGATIVE
Nitrite: NEGATIVE
Protein, ur: NEGATIVE mg/dL
Specific Gravity, Urine: >1.03 — ABNORMAL HIGH (ref 1.005–1.030)
pH: 5.5 (ref 5.0–8.0)

## 2023-07-10 LAB — LIPASE, BLOOD: Lipase: 26 U/L (ref 11–51)

## 2023-07-10 MED ORDER — PANTOPRAZOLE SODIUM 20 MG PO TBEC: 20.0000 mg | DELAYED_RELEASE_TABLET | Freq: Every day | ORAL | 0 refills | Status: DC

## 2023-07-10 MED ORDER — FAMOTIDINE 20 MG PO TABS: 20.0000 mg | ORAL_TABLET | Freq: Two times a day (BID) | ORAL | 0 refills | Status: DC

## 2023-07-10 MED ORDER — ONDANSETRON 8 MG PO TBDP: ORAL_TABLET | ORAL | 0 refills | Status: DC

## 2023-07-10 NOTE — ED Triage Notes (Addendum)
 Patient states that after he ate around 4;30 pm on Friday, he vomited and started having mid abdominal pain and nausea.  Patient states that this morning when he was brushing his teeth, he put like he was going to throw up.  Patient denies diarrhea.  Patient unsure of fevers.  Patient has history of GERD and has not been taking any medicine for this.

## 2023-07-10 NOTE — ED Provider Notes (Signed)
 HPI  SUBJECTIVE:  James Anthony is a 28 y.o. male who presents with intermittent episodes of nonmigratory, nonradiating epigastric, left flank/left lower quadrant pain described as twisting starting about an hour after eating 3 days ago.  Lasts about 30 minutes and then resolves.  He reports nausea, vomiting, waterbrash.  Had 2 episodes of bilious, sour tasting emesis this morning.  He reports anorexia, lightheadedness, dizziness, but denies syncope.  He has been having several months of this epigastric pain, states that is getting worse.  No fevers, chest pain, shortness of breath, palpitations.  He reports an episode of diaphoresis with this epigastric pain the other day.  He reports wheezing for the past 5 or 6 months, this has not changed recently.  No coughing, burning chest pain, belching.  No abdominal distention, urinary complaints, new or different back pain,.  He is able to exercise without any problem.  Car ride over here was not painful.  He has tried pushing electrolyte containing fluids, and Tylenol .  The fluids and walking help.  Symptoms are worse with strong smells.  It is not associated with exertion, p.o. intake, movement, urination or defecation.  Denies excess NSAID, Tylenol  use.  He does not drink alcohol.  He has a past medical history of GERD, does not remember what medications he was on.  No history of MI, hypercholesterolemia, diabetes, H. pylori infection, abdominal surgeries, coronary disease, hypertension, smoking, CVA, PAD/PVD.  No history of UTI, pyelonephritis, nephrolithiasis, PUD, gallbladder disease, pancreatitis, atrial fibrillation, mesenteric ischemia.  Family history negative for early MI.  He has not seen a PCP in years.  He has a scheduled to establish care with [facility] on July 8.  Past Medical History:  Diagnosis Date   GERD (gastroesophageal reflux disease)     History reviewed. No pertinent surgical history.  Family History  Problem Relation  Age of Onset   Hypertension Mother    Pancreatitis Mother    Other Father        unknown medical history    Social History   Tobacco Use   Smoking status: Former   Smokeless tobacco: Former    Types: Engineer, drilling   Vaping status: Former  Substance Use Topics   Alcohol use: Not Currently    Comment: social   Drug use: Never    No current facility-administered medications for this encounter.  Current Outpatient Medications:    famotidine  (PEPCID ) 20 MG tablet, Take 1 tablet (20 mg total) by mouth 2 (two) times daily., Disp: 40 tablet, Rfl: 0   ondansetron  (ZOFRAN -ODT) 8 MG disintegrating tablet, 1/2- 1 tablet q 8 hr prn nausea, vomiting, Disp: 20 tablet, Rfl: 0   pantoprazole  (PROTONIX ) 20 MG tablet, Take 1 tablet (20 mg total) by mouth daily., Disp: 30 tablet, Rfl: 0  No Known Allergies   ROS  As noted in HPI.   Physical Exam  BP 118/77 (BP Location: Right Arm)   Pulse 64   Temp 97.9 F (36.6 C) (Oral)   Resp 15   Ht 5' 9 (1.753 m)   Wt 102.1 kg   SpO2 96%   BMI 33.24 kg/m   Constitutional: Well developed, well nourished, no acute distress Eyes: PERRL, EOMI, conjunctiva normal bilaterally HENT: Normocephalic, atraumatic,mucus membranes moist Respiratory: Clear to auscultation bilaterally, no rales, no wheezing, no rhonchi Cardiovascular: Normal rate and rhythm, no murmurs, no gallops, no rubs GI: Soft, nondistended, normal bowel sounds, periumbilical and diffuse left-sided/flank tenderness, maximal in the left  lower quadrant region, no rebound, no guarding.  Negative McBurney, negative Murphy.  Tap table test negative Back: no CVAT skin: No rash, skin intact Musculoskeletal: No edema, no tenderness, no deformities Neurologic: Alert & oriented x 3, CN III-XII grossly intact, no motor deficits, sensation grossly intact Psychiatric: Speech and behavior appropriate   ED Course   Medications - No data to display  Orders Placed This Encounter   Procedures   DG Abd Acute W/Chest    Standing Status:   Standing    Number of Occurrences:   1    Reason for Exam (SYMPTOM  OR DIAGNOSIS REQUIRED):   Epigastric/left flank pain with emesis.  Rule out perforation obstruction, left-sided nephrolithiasis   Comprehensive metabolic panel    Standing Status:   Standing    Number of Occurrences:   1   Lipase, blood    Standing Status:   Standing    Number of Occurrences:   1   Urinalysis, Routine w reflex microscopic -Urine, Clean Catch    Standing Status:   Standing    Number of Occurrences:   1    Specimen Source:   Urine, Clean Catch [[id number]]   CBC with Differential    Standing Status:   Standing    Number of Occurrences:   1   ED EKG    Epigastric pain    Standing Status:   Standing    Number of Occurrences:   1    Reason for Exam:   Other (see Comments)   EKG 12-Lead    Standing Status:   Standing    Number of Occurrences:   1   Results for orders placed or performed during the hospital encounter of 07/10/23 (from the past 24 hours)  Comprehensive metabolic panel     Status: Abnormal   Collection Time: 07/10/23  9:12 AM  Result Value Ref Range   Sodium 137 135 - 145 mmol/L   Potassium 4.0 3.5 - 5.1 mmol/L   Chloride 104 98 - 111 mmol/L   CO2 24 22 - 32 mmol/L   Glucose, Bld 107 (H) 70 - 99 mg/dL   BUN 13 6 - 20 mg/dL   Creatinine, Ser 9.13 0.61 - 1.24 mg/dL   Calcium 9.3 8.9 - 89.6 mg/dL   Total Protein 7.8 6.5 - 8.1 g/dL   Albumin 4.5 3.5 - 5.0 g/dL   AST 29 15 - 41 U/L   ALT 37 0 - 44 U/L   Alkaline Phosphatase 69 38 - 126 U/L   Total Bilirubin 0.7 0.0 - 1.2 mg/dL   GFR, Estimated >39 >39 mL/min   Anion gap 9 5 - 15  Lipase, blood     Status: None   Collection Time: 07/10/23  9:12 AM  Result Value Ref Range   Lipase 26 11 - 51 U/L  Urinalysis, Routine w reflex microscopic -Urine, Clean Catch     Status: Abnormal   Collection Time: 07/10/23  9:12 AM  Result Value Ref Range   Color, Urine YELLOW YELLOW   APPearance  CLEAR CLEAR   Specific Gravity, Urine >1.030 (H) 1.005 - 1.030   pH 5.5 5.0 - 8.0   Glucose, UA NEGATIVE NEGATIVE mg/dL   Hgb urine dipstick NEGATIVE NEGATIVE   Bilirubin Urine NEGATIVE NEGATIVE   Ketones, ur NEGATIVE NEGATIVE mg/dL   Protein, ur NEGATIVE NEGATIVE mg/dL   Nitrite NEGATIVE NEGATIVE   Leukocytes,Ua NEGATIVE NEGATIVE  CBC with Differential     Status: None  Collection Time: 07/10/23  9:12 AM  Result Value Ref Range   WBC 7.9 4.0 - 10.5 K/uL   RBC 4.97 4.22 - 5.81 MIL/uL   Hemoglobin 14.6 13.0 - 17.0 g/dL   HCT 58.5 60.9 - 47.9 %   MCV 83.3 80.0 - 100.0 fL   MCH 29.4 26.0 - 34.0 pg   MCHC 35.3 30.0 - 36.0 g/dL   RDW 86.8 88.4 - 84.4 %   Platelets 238 150 - 400 K/uL   nRBC 0.0 0.0 - 0.2 %   Neutrophils Relative % 69 %   Neutro Abs 5.4 1.7 - 7.7 K/uL   Lymphocytes Relative 19 %   Lymphs Abs 1.5 0.7 - 4.0 K/uL   Monocytes Relative 10 %   Monocytes Absolute 0.8 0.1 - 1.0 K/uL   Eosinophils Relative 2 %   Eosinophils Absolute 0.2 0.0 - 0.5 K/uL   Basophils Relative 0 %   Basophils Absolute 0.0 0.0 - 0.1 K/uL   Immature Granulocytes 0 %   Abs Immature Granulocytes 0.03 0.00 - 0.07 K/uL   DG Abd Acute W/Chest Addendum Date: 07/10/2023 ADDENDUM REPORT: 07/10/2023 11:02 ADDENDUM: Findings conveyed toASHLEY Kadi Hession on 07/10/2023  at11:02. Electronically Signed   By: Jackquline Boxer M.D.   On: 07/10/2023 11:02   Addendum Date: 07/10/2023 ADDENDUM REPORT: 07/10/2023 10:36 ADDENDUM: Upon further consideration, the thickening of the gastric fundus is favored within normal limits for gastric mucosal fundal thickening. No specific follow-up recommended localized pain. Electronically Signed   By: Jackquline Boxer M.D.   On: 07/10/2023 10:36   Result Date: 07/10/2023 CLINICAL DATA:  MCM-DGEpigastric/left flank pain with emesis. Rule out perforation obstruction, left-sided nephrolithiasis EXAM: DG ABDOMEN ACUTE WITH 1 VIEW CHEST COMPARISON:  CT 08/24/2015 FINDINGS: Normal  mediastinum and cardiac silhouette. Normal pulmonary vasculature. No evidence of effusion, infiltrate, or pneumothorax. No acute bony abnormality. No dilated large or small bowel. Gas and stool in the rectum. Apparent thickening of the gastric mucosa within the proximal stomach/gastric fundus to 2 cm. No pathologic calcifications identified. IMPRESSION: 1. No evidence of bowel obstruction. 2. Apparent thickening of the gastric mucosa. Consider CT abdomen pelvis for further verification. 3. No evidence of nephrolithiasis. 4. No acute cardiopulmonary process. Electronically Signed: By: Jackquline Boxer M.D. On: 07/10/2023 10:22    ED Clinical Impression  1. Abdominal pain, unspecified abdominal location      ED Assessment/Plan     Pt abd exam is benign, no peritoneal signs. No evidence to suggest testicular source for abdominal pain.  Will check an EKG because he is reporting epigastric pain with some shortness of breath, but I believe ACS is low in the differential.  He also has left flank, left lower quadrant and periumbilical tenderness.  Differential includes nephrolithiasis, pancreatitis, peptic ulcer disease, gastritis, diverticulitis, although he is reporting pain primarily in the epigastric/left flank region rather than the left lower quadrant.  There is no palpable splenomegaly.  No CVAT.  Will check UA, CBC, CMP, lipase, acute abdominal series.  EKG: Sinus bradycardia, rate 49.  Normal axis, normal intervals.  No hypertrophy.  No ST-T wave changes  HEART score:   History: Slightly suspicious 0 EKG: Normal 0 Age: Below 45 0 Risk factors: No known risk factors 0 Troponin: Not available  Total score 0.  He is at low risk for MACE.  Discussed this with patient.  Reviewed imaging independently.  No air-fluid levels, free air.  Formal radiology overread pending.  Will contact patient [phone] if radiology overread  differs enough from mine and we need to change management.  Reviewed  radiology report and discussed with radiology.  No perforation, obstruction, nephrolithiasis consistent with my read.  Gastric thickening originally deemed possibly concerning is within normal limits no specific follow-up is recommended. See radiology report for full details.  CBC, CMP, lipase, UA normal.  His urine is concentrated,  Labs, imaging reassuring.  no evidence of surgical abd. Doubt SBO, mesenteric ischemia, appendicitis, hepatitis, cholecystitis, pancreatitis, or perforated viscus.  Patient presents with nonspecific abdominal pain.  EKG, acute abdominal series, labs reassuring.  Home with Pepcid , Protonix , Zofran .  He is establishing care with a PCP in 2 days.  ER return precautions given.  Work note for 2 days.  May return sooner if feeling better  Discussed labs, imaging, MDM, treatment plan, and plan for follow-up with patient Discussed sn/sx that should prompt return to the ED. patient agrees with plan.   Meds ordered this encounter  Medications   famotidine  (PEPCID ) 20 MG tablet    Sig: Take 1 tablet (20 mg total) by mouth 2 (two) times daily.    Dispense:  40 tablet    Refill:  0   pantoprazole  (PROTONIX ) 20 MG tablet    Sig: Take 1 tablet (20 mg total) by mouth daily.    Dispense:  30 tablet    Refill:  0   ondansetron  (ZOFRAN -ODT) 8 MG disintegrating tablet    Sig: 1/2- 1 tablet q 8 hr prn nausea, vomiting    Dispense:  20 tablet    Refill:  0      *This clinic note was created using Scientist, clinical (histocompatibility and immunogenetics). Therefore, there may be occasional mistakes despite careful proofreading. ?    Van Knee, MD 07/11/23 1108

## 2023-07-10 NOTE — ED Notes (Signed)
 Patient has been scheduled an appointment to establish care with Primary Care in [place] on July 12, 2023

## 2023-07-10 NOTE — Discharge Instructions (Addendum)
 We will contact you if and only if the radiology overread differs enough from mine we need to change management.  I did not appreciate any kidney stone, evidence of perforation or obstruction on your x-ray.  Your labs are reassuring.  I am starting you on Pepcid  and Protonix .  This will help with acid reflux.  Zofran  as needed for nausea.  Push electrolyte containing fluids such as Pedialyte, liquid IV, Gatorade.  Follow-up with [place] [facility] as scheduled on July 8.  Go to the ER if you get worse, fevers above 100.4, or for any concerns.

## 2023-07-12 ENCOUNTER — Ambulatory Visit: Payer: Self-pay | Admitting: Family Medicine

## 2023-07-22 ENCOUNTER — Ambulatory Visit: Payer: Self-pay | Admitting: Physician Assistant

## 2023-10-05 ENCOUNTER — Ambulatory Visit (INDEPENDENT_AMBULATORY_CARE_PROVIDER_SITE_OTHER)

## 2023-10-05 ENCOUNTER — Encounter: Payer: Self-pay | Admitting: Emergency Medicine

## 2023-10-05 ENCOUNTER — Ambulatory Visit
Admission: EM | Admit: 2023-10-05 | Discharge: 2023-10-05 | Disposition: A | Discharge status: Home / Self Care | Attending: Family Medicine | Admitting: Family Medicine

## 2023-10-05 DIAGNOSIS — S90211A Contusion of right great toe with damage to nail, initial encounter: Secondary | ICD-10-CM | POA: Diagnosis not present

## 2023-10-05 DIAGNOSIS — M79674 Pain in right toe(s): Secondary | ICD-10-CM

## 2023-10-05 MED ORDER — HYDROCODONE-ACETAMINOPHEN 5-325 MG PO TABS: 1.0000 | ORAL_TABLET | ORAL | 0 refills | Status: DC | PRN

## 2023-10-05 NOTE — Discharge Instructions (Addendum)
 On review of your xray images, you did not have any fractures or dislocated bones. You should see your results in MyChart. Stop by the pharmacy to pick up your pain medication.  Do not drive or operate heavy machinery with taking opioid pain medication. Follow up with your primary care provider or return to the urgent care, if not improving.

## 2023-10-05 NOTE — ED Triage Notes (Signed)
 Pt dropped a piece of wood on his right big toe today. His big toe is bleeding and the nail is lifted.

## 2023-10-05 NOTE — ED Provider Notes (Signed)
 [facility]    CSN: [id number] Arrival date & time: 10/05/23  1843      History   Chief Complaint Chief Complaint  Patient presents with   Toe Injury    HPI  HPI James Anthony is a 28 y.o. male.   James Anthony presents for left great pain after dropping a piece of wood on it about a hour ago. Has some swelling and bleeding of his food.  He cleaned it with a peroxide, alcohol, soap with water.  He broke his toenail.     Past Medical History:  Diagnosis Date   GERD (gastroesophageal reflux disease)     There are no active problems to display for this patient.   History reviewed. No pertinent surgical history.     Home Medications    Prior to Admission medications   Medication Sig Start Date End Date Taking? Authorizing Provider  HYDROcodone -acetaminophen  (NORCO/VICODIN) 5-325 MG tablet Take 1 tablet by mouth every 4 (four) hours as needed. 10/05/23  Yes Ahron Hulbert, DO  pantoprazole  (PROTONIX ) 20 MG tablet Take 1 tablet (20 mg total) by mouth daily. 07/10/23  Yes Van Knee, MD  famotidine  (PEPCID ) 20 MG tablet Take 1 tablet (20 mg total) by mouth 2 (two) times daily. 07/10/23   Van Knee, MD  ondansetron  (ZOFRAN -ODT) 8 MG disintegrating tablet 1/2- 1 tablet q 8 hr prn nausea, vomiting 07/10/23   Van Knee, MD    Family History Family History  Problem Relation Age of Onset   Hypertension Mother    Pancreatitis Mother    Other Father        unknown medical history    Social History Social History   Tobacco Use   Smoking status: Former   Smokeless tobacco: Former    Types: Associate Professor status: Former  Substance Use Topics   Alcohol use: Not Currently    Comment: social   Drug use: Never     Allergies   Patient has no known allergies.   Review of Systems Review of Systems: :negative unless otherwise stated in HPI.      Physical Exam Triage Vital Signs ED Triage Vitals  Encounter Vitals Group      BP 10/05/23 1857 126/76     Girls Systolic BP Percentile --      Girls Diastolic BP Percentile --      Boys Systolic BP Percentile --      Boys Diastolic BP Percentile --      Pulse Rate 10/05/23 1857 96     Resp 10/05/23 1857 16     Temp 10/05/23 1857 98.4 F (36.9 C)     Temp Source 10/05/23 1857 Oral     SpO2 10/05/23 1857 95 %     Weight --      Height --      Head Circumference --      Peak Flow --      Pain Score 10/05/23 1856 8     Pain Loc --      Pain Education --      Exclude from Growth Chart --    No data found.  Updated Vital Signs BP 126/76 (BP Location: Right Arm)   Pulse 96   Temp 98.4 F (36.9 C) (Oral)   Resp 16   SpO2 95%   Visual Acuity Right Eye Distance:   Left Eye Distance:   Bilateral Distance:    Right Eye Near:   Left Eye  Near:    Bilateral Near:     Physical Exam GEN: well appearing male in no acute distress  CVS: well perfused  RESP: speaking in full sentences without pause, no respiratory distress  MSK:   Ankle/Foot, Right: TTP noted at the great toe with erythema, swelling but no ecchymosis, or bony deformity. Bleeding from cracked toenail.  No evidence of tibiotalar deviation; Range of motion is full in all directions. Strength is 5/5 in all directions. No tenderness at the insertion/body/myotendinous junction of the Achilles tendon; No tenderness on posterior aspects of lateral and medial malleolus; Unremarkable squeeze; Talar dome nontender; Unremarkable calcaneal squeeze; No plantar calcaneal tenderness; No tenderness over the navicular prominence or  over cuboid; No pain at base of 5th MT; No tenderness at the distal metatarsals; Able to walk 4 steps.     UC Treatments / Results  Labs (all labs ordered are listed, but only abnormal results are displayed) Labs Reviewed - No data to display  EKG   Radiology DG Toe Great Right Result Date: 10/05/2023 CLINICAL DATA:  acute trauma today with bleeding wound, pain, swelling EXAM:  RIGHT GREAT TOE COMPARISON:  None Available. FINDINGS: No acute fracture or dislocation. There is no evidence of arthropathy or other focal bone abnormality. Possible subcutaneous gas along the dorsal aspect of the distal first phalanx. No radiopaque foreign body. IMPRESSION: Possible subcutaneous gas along the dorsal aspect of the distal first phalanx. Otherwise, no acute fracture or dislocation. No radiopaque foreign body. Electronically Signed   By: Rogelia Myers M.D.   On: 10/05/2023 19:44     Procedures Procedures (including critical care time)  Medications Ordered in UC Medications - No data to display  Initial Impression / Assessment and Plan / UC Course  I have reviewed the triage vital signs and the nursing notes.  Pertinent labs & imaging results that were available during my care of the patient were reviewed by me and considered in my medical decision making (see chart for details).      Pt is a 28 y.o.  male with right great toe pain that occurred today after dropping ~25 lb piece of wood on his foot just prior to arrival.  On exam, pt has tenderness and bleeding at great toe with subungual hematoma concerning for fracture.   Obtained left great plain films.  Personally interpreted by me were unremarkable for fracture or dislocation. Radiologist report reviewed. Wound cleansed and sterile dressing applied.    Patient to gradually return to normal activities, as tolerated and continue ordinary activities within the limits permitted by pain. Prescribed short course Norco PRN for pain.  Motrin  as needed.   Patient to follow up with orthopedic provider, if symptoms do not improve with conservative treatment.  Return and ED precautions given. Understanding voiced. Discussed MDM, treatment plan and plan for follow-up with patient  who agrees with plan.   Final Clinical Impressions(s) / UC Diagnoses   Final diagnoses:  Great toe pain, right  Subungual hematoma of great toe of right  foot, initial encounter     Discharge Instructions      On review of your xray images, you did not have any fractures or dislocated bones. You should see your results in MyChart. Stop by the pharmacy to pick up your pain medication.  Do not drive or operate heavy machinery with taking opioid pain medication. Follow up with your primary care provider or return to the urgent care, if not improving.  ED Prescriptions     Medication Sig Dispense Auth. Provider   HYDROcodone -acetaminophen  (NORCO/VICODIN) 5-325 MG tablet Take 1 tablet by mouth every 4 (four) hours as needed. 10 tablet Django Nguyen, DO      I have reviewed the PDMP during this encounter.   Kriste Berth, DO 10/08/23 1038

## 2023-11-28 ENCOUNTER — Ambulatory Visit
Admission: EM | Admit: 2023-11-28 | Discharge: 2023-11-28 | Disposition: A | Discharge status: Home / Self Care | Attending: Emergency Medicine | Admitting: Emergency Medicine

## 2023-11-28 DIAGNOSIS — K047 Periapical abscess without sinus: Secondary | ICD-10-CM | POA: Diagnosis not present

## 2023-11-28 MED ORDER — AMOXICILLIN-POT CLAVULANATE 875-125 MG PO TABS: 1.0000 | ORAL_TABLET | Freq: Two times a day (BID) | ORAL | 0 refills | Status: AC

## 2023-11-28 NOTE — ED Provider Notes (Signed)
 [facility]    CSN: [id number] Arrival date & time: 11/28/23  1440      History   Chief Complaint Chief Complaint  Patient presents with   Oral Pain    HPI James Anthony is a 28 y.o. male.   HPI  28 year old male with past medical history significant for reflux disease presents for evaluation of 1 week of left upper dental pain.  He has had discharge on 1 occasion.  No facial swelling or fever.  He was at the dentist earlier today who advised him to come to urgent care for antibiotics.  Past Medical History:  Diagnosis Date   GERD (gastroesophageal reflux disease)     There are no active problems to display for this patient.   History reviewed. No pertinent surgical history.     Home Medications    Prior to Admission medications   Medication Sig Start Date End Date Taking? Authorizing Provider  amoxicillin -clavulanate (AUGMENTIN ) 875-125 MG tablet Take 1 tablet by mouth every 12 (twelve) hours for 10 days. 11/28/23 12/08/23 Yes Bernardino Ditch, NP    Family History Family History  Problem Relation Age of Onset   Hypertension Mother    Pancreatitis Mother    Other Father        unknown medical history    Social History Social History   Tobacco Use   Smoking status: Former   Smokeless tobacco: Former    Types: Associate Professor status: Former  Substance Use Topics   Alcohol use: Not Currently    Comment: social   Drug use: Never     Allergies   Patient has no known allergies.   Review of Systems Review of Systems  Constitutional:  Negative for fever.  HENT:  Positive for dental problem. Negative for facial swelling.      Physical Exam Triage Vital Signs ED Triage Vitals  Encounter Vitals Group     BP 11/28/23 1501 127/74     Girls Systolic BP Percentile --      Girls Diastolic BP Percentile --      Boys Systolic BP Percentile --      Boys Diastolic BP Percentile --      Pulse Rate 11/28/23 1501 63     Resp  11/28/23 1501 16     Temp 11/28/23 1501 98 F (36.7 C)     Temp Source 11/28/23 1501 Oral     SpO2 11/28/23 1501 96 %     Weight 11/28/23 1458 220 lb 4.8 oz (99.9 kg)     Height --      Head Circumference --      Peak Flow --      Pain Score 11/28/23 1501 8     Pain Loc --      Pain Education --      Exclude from Growth Chart --    No data found.  Updated Vital Signs BP 127/74 (BP Location: Right Arm)   Pulse 63   Temp 98 F (36.7 C) (Oral)   Resp 16   Wt 220 lb 4.8 oz (99.9 kg)   SpO2 96%   BMI 32.53 kg/m   Visual Acuity Right Eye Distance:   Left Eye Distance:   Bilateral Distance:    Right Eye Near:   Left Eye Near:    Bilateral Near:     Physical Exam Vitals and nursing note reviewed.  Constitutional:      Appearance: Normal appearance.  He is not ill-appearing.  HENT:     Head: Normocephalic and atraumatic.     Mouth/Throat:     Mouth: Mucous membranes are moist.     Pharynx: Oropharynx is clear. Posterior oropharyngeal erythema present. No oropharyngeal exudate.  Skin:    General: Skin is warm and dry.     Capillary Refill: Capillary refill takes less than 2 seconds.  Neurological:     General: No focal deficit present.     Mental Status: He is alert and oriented to person, place, and time.      UC Treatments / Results  Labs (all labs ordered are listed, but only abnormal results are displayed) Labs Reviewed - No data to display  EKG   Radiology No results found.  Procedures Procedures (including critical care time)  Medications Ordered in UC Medications - No data to display  Initial Impression / Assessment and Plan / UC Course  I have reviewed the triage vital signs and the nursing notes.  Pertinent labs & imaging results that were available during my care of the patient were reviewed by me and considered in my medical decision making (see chart for details).   Patient is a pleasant, nontoxic-appearing 28 year old male presenting for  evaluation of pain in his left upper teeth.  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  Angie can see manage above, there is edema of the gum tissue as well as some mild erythema with significant decay.  I do not appreciate any discharge at present.  He has no external visible or palpable swelling.  I will discharge him home on Augmentin  875 twice daily for 10 days.  He is to follow-up with his dentist.  If he develops any facial swelling or fever he should go to the ER in [place] to be evaluated by the dentist or oral surgeon on-call.   Final Clinical Impressions(s) / UC Diagnoses   Final diagnoses:  Dental infection     Discharge Instructions      Take the Augmentin  twice daily with food for 10 days for treatment of your dental infection.  Use over-the-counter Tylenol  and ibuprofen  for swelling and mild to moderate pain.  Rinse with warm salt water, or Listerine, after each meal to remove food particles and wash away any pus that is collecting.  If you develop any increasing or swelling, fever, pain, or difficulty swallowing you to go to the emergency department at [facility] with a have an oral surgeon and also a dentist on-call.      ED Prescriptions     Medication Sig Dispense Auth. Provider   amoxicillin -clavulanate (AUGMENTIN ) 875-125 MG tablet Take 1 tablet by mouth every 12 (twelve) hours for 10 days. 20 tablet Bernardino Ditch, NP      PDMP not reviewed this encounter.   Bernardino Ditch, NP 11/28/23 [phone]

## 2023-11-28 NOTE — Discharge Instructions (Signed)
Take the Augmentin twice daily with food for 10 days for treatment of your dental infection.  Use over-the-counter Tylenol and ibuprofen for swelling and mild to moderate pain.  Rinse with warm salt water, or Listerine, after each meal to remove food particles and wash away any pus that is collecting.  If you develop any increasing or swelling, fever, pain, or difficulty swallowing you to go to the emergency department at [facility] with a have an oral surgeon and also a dentist on-call.

## 2023-11-28 NOTE — ED Triage Notes (Signed)
 Pt c/o L sided oral pain x1 wk. States has been dealing with infection in tooth. Was seen by dentist & was told to come in for abx before being treated.
